# Patient Record
Sex: Male | Born: 1970 | Race: Black or African American | Hispanic: No | Marital: Single | Smoking: Current every day smoker
Health system: Southern US, Community
[De-identification: ages and names within clinical notes are randomized; demographics above are authoritative.]

## PROBLEM LIST (undated history)

## (undated) DIAGNOSIS — I8393 Asymptomatic varicose veins of bilateral lower extremities: Secondary | ICD-10-CM

## (undated) DIAGNOSIS — I10 Essential (primary) hypertension: Secondary | ICD-10-CM

## (undated) DIAGNOSIS — E119 Type 2 diabetes mellitus without complications: Secondary | ICD-10-CM

## (undated) DIAGNOSIS — H0014 Chalazion left upper eyelid: Secondary | ICD-10-CM

## (undated) HISTORY — DX: Asymptomatic varicose veins of bilateral lower extremities: I83.93

## (undated) HISTORY — PX: OTHER SURGICAL HISTORY: SHX169

## (undated) HISTORY — DX: Chalazion left upper eyelid: H00.14

## (undated) HISTORY — DX: Type 2 diabetes mellitus without complications: E11.9

---

## 2002-05-24 DIAGNOSIS — I10 Essential (primary) hypertension: Secondary | ICD-10-CM

## 2002-05-24 HISTORY — DX: Essential (primary) hypertension: I10

## 2004-09-27 ENCOUNTER — Emergency Department (HOSPITAL_COMMUNITY): Admission: EM | Admit: 2004-09-27 | Discharge: 2004-09-27 | Payer: Self-pay | Admitting: Emergency Medicine

## 2010-08-11 ENCOUNTER — Emergency Department (HOSPITAL_COMMUNITY)
Admission: EM | Admit: 2010-08-11 | Discharge: 2010-08-11 | Disposition: A | Discharge status: Home / Self Care | Payer: Self-pay | Attending: Emergency Medicine | Admitting: Emergency Medicine

## 2010-08-11 ENCOUNTER — Emergency Department (HOSPITAL_COMMUNITY): Payer: Self-pay

## 2010-08-11 DIAGNOSIS — M25569 Pain in unspecified knee: Secondary | ICD-10-CM | POA: Insufficient documentation

## 2010-08-11 DIAGNOSIS — R109 Unspecified abdominal pain: Secondary | ICD-10-CM | POA: Insufficient documentation

## 2010-08-11 DIAGNOSIS — R51 Headache: Secondary | ICD-10-CM | POA: Insufficient documentation

## 2010-08-11 DIAGNOSIS — E119 Type 2 diabetes mellitus without complications: Secondary | ICD-10-CM | POA: Insufficient documentation

## 2010-08-11 DIAGNOSIS — R112 Nausea with vomiting, unspecified: Secondary | ICD-10-CM | POA: Insufficient documentation

## 2010-08-11 LAB — DIFFERENTIAL
Basophils Absolute: 0 10*3/uL (ref 0.0–0.1)
Eosinophils Absolute: 0.1 10*3/uL (ref 0.0–0.7)
Lymphocytes Relative: 44 % (ref 12–46)
Lymphs Abs: 3.8 10*3/uL (ref 0.7–4.0)
Monocytes Absolute: 0.9 10*3/uL (ref 0.1–1.0)
Neutro Abs: 3.9 10*3/uL (ref 1.7–7.7)

## 2010-08-11 LAB — COMPREHENSIVE METABOLIC PANEL
AST: 21 U/L (ref 0–37)
BUN: 8 mg/dL (ref 6–23)
Calcium: 9.6 mg/dL (ref 8.4–10.5)
GFR calc non Af Amer: >60 mL/min (ref >60)
Sodium: 142 mEq/L (ref 135–145)
Total Protein: 7.5 g/dL (ref 6.0–8.3)

## 2010-08-11 LAB — CBC
HCT: 44.6 % (ref 39.0–52.0)
Hemoglobin: 15.4 g/dL (ref 13.0–17.0)
MCH: 32.1 pg (ref 26.0–34.0)
MCV: 92.9 fL (ref 78.0–100.0)
WBC: 8.7 10*3/uL (ref 4.0–10.5)

## 2010-08-11 LAB — URINE MICROSCOPIC-ADD ON

## 2010-08-11 LAB — URINALYSIS, ROUTINE W REFLEX MICROSCOPIC
Bilirubin Urine: NEGATIVE
Glucose, UA: NEGATIVE mg/dL
Ketones, ur: NEGATIVE mg/dL
Nitrite: NEGATIVE
Protein, ur: NEGATIVE mg/dL
Urobilinogen, UA: 0.2 mg/dL (ref 0.0–1.0)
pH: 6.5 (ref 5.0–8.0)

## 2010-08-11 LAB — GLUCOSE, CAPILLARY

## 2011-04-09 ENCOUNTER — Emergency Department (HOSPITAL_COMMUNITY)
Admission: EM | Admit: 2011-04-09 | Discharge: 2011-04-09 | Disposition: A | Discharge status: Home / Self Care | Payer: Self-pay | Attending: Emergency Medicine | Admitting: Emergency Medicine

## 2011-04-09 ENCOUNTER — Emergency Department (HOSPITAL_COMMUNITY): Payer: Self-pay

## 2011-04-09 ENCOUNTER — Encounter: Payer: Self-pay | Admitting: Emergency Medicine

## 2011-04-09 DIAGNOSIS — S63509A Unspecified sprain of unspecified wrist, initial encounter: Secondary | ICD-10-CM | POA: Insufficient documentation

## 2011-04-09 DIAGNOSIS — W230XXA Caught, crushed, jammed, or pinched between moving objects, initial encounter: Secondary | ICD-10-CM | POA: Insufficient documentation

## 2011-04-09 DIAGNOSIS — M7989 Other specified soft tissue disorders: Secondary | ICD-10-CM | POA: Insufficient documentation

## 2011-04-09 DIAGNOSIS — F17200 Nicotine dependence, unspecified, uncomplicated: Secondary | ICD-10-CM | POA: Insufficient documentation

## 2011-04-09 DIAGNOSIS — S63501A Unspecified sprain of right wrist, initial encounter: Secondary | ICD-10-CM

## 2011-04-09 DIAGNOSIS — M25539 Pain in unspecified wrist: Secondary | ICD-10-CM | POA: Insufficient documentation

## 2011-04-09 MED ORDER — IBUPROFEN 800 MG PO TABS
800.0000 mg | ORAL_TABLET | Freq: Once | ORAL | Status: AC
  Administered 2011-04-09: 800 mg via ORAL
  Filled 2011-04-09: qty 1

## 2011-04-09 MED ORDER — IBUPROFEN 800 MG PO TABS: 800.0000 mg | ORAL_TABLET | Freq: Three times a day (TID) | ORAL | Status: AC | PRN

## 2011-04-09 MED ORDER — HYDROCODONE-ACETAMINOPHEN 5-325 MG PO TABS: 2.0000 | ORAL_TABLET | ORAL | Status: AC | PRN

## 2011-04-09 NOTE — ED Notes (Signed)
Patient given discharge paperwork.  Went over discharge instructions with patient; instructed patient to take Norco and ibuprofen as directed, to follow up with the orthopedist, and to return to the ED for new, worsening, or concerning symptoms.

## 2011-04-09 NOTE — Progress Notes (Signed)
Orthopedic Tech Progress Note Patient Details:  Cameron Bean 03/16/1971 [id number]  Other Ortho Devices Type of Ortho Device:  (v elcro- wrist splint) Ortho Device Location: right  hand Ortho Device Interventions: Application   Irish Lack 04/09/2011, 7:56 PM

## 2011-04-09 NOTE — ED Notes (Signed)
Received bedside report from [place], [place].  Patient currently resting quietly in bed watching television on his laptop; introduced self to patient.  Patient has no questions, concerns, or requests at this time.  Will continue to monitor.

## 2011-04-09 NOTE — ED Notes (Signed)
Ortho states that they are on their way to apply wrist splint.
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
## 2011-04-09 NOTE — ED Notes (Signed)
Ortho at bedside.

## 2011-04-09 NOTE — ED Notes (Signed)
Paged ortho to apply wrist splint.

## 2012-01-07 ENCOUNTER — Encounter (HOSPITAL_COMMUNITY): Payer: Self-pay

## 2012-01-07 ENCOUNTER — Emergency Department (HOSPITAL_COMMUNITY)
Admission: EM | Admit: 2012-01-07 | Discharge: 2012-01-08 | Disposition: A | Discharge status: Home / Self Care | Payer: Self-pay | Attending: Emergency Medicine | Admitting: Emergency Medicine

## 2012-01-07 DIAGNOSIS — M79609 Pain in unspecified limb: Secondary | ICD-10-CM | POA: Insufficient documentation

## 2012-01-07 DIAGNOSIS — M7989 Other specified soft tissue disorders: Secondary | ICD-10-CM | POA: Insufficient documentation

## 2012-01-07 DIAGNOSIS — B354 Tinea corporis: Secondary | ICD-10-CM | POA: Insufficient documentation

## 2012-01-07 DIAGNOSIS — I10 Essential (primary) hypertension: Secondary | ICD-10-CM | POA: Insufficient documentation

## 2012-01-07 DIAGNOSIS — F17200 Nicotine dependence, unspecified, uncomplicated: Secondary | ICD-10-CM | POA: Insufficient documentation

## 2012-01-07 HISTORY — DX: Essential (primary) hypertension: I10

## 2012-01-07 LAB — CBC WITH DIFFERENTIAL/PLATELET
Basophils Absolute: 0 10*3/uL (ref 0.0–0.1)
Basophils Relative: 0 % (ref 0–1)
MCH: 31.7 pg (ref 26.0–34.0)
Monocytes Relative: 8 % (ref 3–12)
Neutro Abs: 4.5 10*3/uL (ref 1.7–7.7)
Neutrophils Relative %: 51 % (ref 43–77)

## 2012-01-07 LAB — BASIC METABOLIC PANEL
BUN: 9 mg/dL (ref 6–23)
CO2: 30 mEq/L (ref 19–32)
GFR calc Af Amer: >90 mL/min (ref >90)
Glucose, Bld: 139 mg/dL — ABNORMAL HIGH (ref 70–99)
Potassium: 4.4 mEq/L (ref 3.5–5.1)
Sodium: 141 mEq/L (ref 135–145)

## 2012-01-07 NOTE — ED Notes (Signed)
Pt advised of his wait time

## 2012-01-07 NOTE — ED Provider Notes (Signed)
History     CSN: [id number]  Arrival date & time 01/07/12  1649   First MD Initiated Contact with Patient 01/07/12 2037      Chief Complaint  Patient presents with  . Leg Swelling    (Consider location/radiation/quality/duration/timing/severity/associated sxs/prior treatment) HPI This is a 41 year old man, previously healthy, presents with unilateral left leg pain for 2 weeks. He reports pain that his dull, 5/10 and constant in the same leg. He does not remember history of trauma to this leg, but it started out when he was work out. He denies history of fever, but reports chills, and reduced appetite. The pain is worse when he walks and relieved when he is in stable and when it is in an elevated position. This is the first time he has experienced such swelling of his extremity. He has no history of blood clots and no history of long distance travel recently. He is still able to walk, but with soreness in his left calf muscle. He admits to a history of smoking about one pack a day for the last 18 years, but he denies IV drug use. He has a remote history of type 2 diabetes, but he says that he is no longer diabetic after he started losing weight and changing his diet. Prior to that he had been started on metformin, which he stopped.  Past Medical History  Diagnosis Date  . Hypertension     No past surgical history on file.  No family history on file.  History  Substance Use Topics  . Smoking status: Current Everyday Smoker  . Smokeless tobacco: Not on file  . Alcohol Use: No      Review of Systems  Constitutional: Positive for chills, appetite change and fatigue. Negative for fever, diaphoresis, activity change and unexpected weight change.  HENT: Negative.   Eyes: Negative.   Respiratory: Negative for apnea, cough, choking, chest tightness, shortness of breath, wheezing and stridor.   Cardiovascular: Negative for chest pain, palpitations and leg swelling.  Genitourinary:  Negative.   Musculoskeletal: Negative for myalgias, back pain, joint swelling, arthralgias and gait problem.  Hematological: Negative.   Psychiatric/Behavioral: Negative.     Allergies  Review of patient's allergies indicates no known allergies.  Home Medications   Current Outpatient Rx  Name Route Sig Dispense Refill  . CETIRIZINE HCL 10 MG PO TABS Oral Take 10 mg by mouth daily.      Carma Leaven M PLUS PO TABS Oral Take 1 tablet by mouth daily.        BP 138/73  Pulse 73  Temp 98.3 F (36.8 C) (Oral)  Resp 18  SpO2 100%  Physical Exam  Constitutional: He is oriented to person, place, and time. He appears well-nourished. No distress.  HENT:  Head: Normocephalic and atraumatic.  Eyes: Conjunctivae and EOM are normal. Pupils are equal, round, and reactive to light.  Neck: Normal range of motion. Neck supple.  Cardiovascular: Normal rate, regular rhythm and normal heart sounds.  Exam reveals no gallop and no friction rub.   No murmur heard. Pulmonary/Chest: Effort normal and breath sounds normal. No respiratory distress. He has no wheezes. He has no rales. He exhibits no tenderness.  Abdominal: Soft. Bowel sounds are normal.  Musculoskeletal: He exhibits edema and tenderness.       Right lower leg: Normal.       Left lower leg: He exhibits tenderness, swelling, edema and laceration. He exhibits no bony tenderness and no deformity.  The left leg is visibly swollen to the level of the knee. The skin has a small laceration around the shin area. It does not look septic. The distal pulses are present and strong. There is some tenderness on the palpation of the calf muscle. It fills only slightly warmer than the left. There is no hyperemia or any deformity. There is no groin lymph nodes swelling.   The right leg is normal on examination.   Neurological: He is alert and oriented to person, place, and time. He has normal reflexes. No cranial nerve deficit.  Skin: Rash noted. He is  not diaphoretic. No pallor.  Psychiatric: He has a normal mood and affect.    ED Course  Procedures (including critical care time)  Labs Reviewed  BASIC METABOLIC PANEL - Abnormal; Notable for the following:    Glucose, Bld 139 (*)     All other components within normal limits  CBC WITH DIFFERENTIAL   Dg Tibia/fibula Left  01/08/2012  *RADIOLOGY REPORT*  Clinical Data: Pain and swelling.  Numbness and tingling for the past 2 weeks.  LEFT TIBIA AND FIBULA - 2 VIEW  Comparison: Left knee 08/11/2010  Findings: Stable appearance of degenerative changes in the left knee.  No evidence of acute fracture or subluxation of the left tibia or fibula.  Serpiginous shadows in the soft tissues with calcification suggesting venous varices.  No focal bone lesion or bone destruction.  Bone cortex and trabecular architecture appear intact.  No abnormal periosteal reaction.  IMPRESSION: No evidence of acute fracture or subluxation.  Stable degenerative changes in the knee.  Soft tissue changes consistent with venous varices.  Original Report Authenticated By: Marlon Pel, M.D.     No diagnosis found.    MDM  1. Left Leg Swelling and Pain And name man with unilateral left leg sweating and pain without a clear history of fever or chills, there is a concern over DVT in that leg versus a cellulitis or osteomyelitis. His Well's score for a DVT is 3 which places him in low risk. He is a very smoker but no other risk factors for a blood clot. We will need to do a doppler scan tomorrow. Xray of the left Tibular/Fibular is normal with some evidence of venous varices. CBC and Bmet are within normal ranges.  2. Tinea Corporis I have prescribed Clotrimazole cream.        Dow Adolph, MD 01/08/12 [phone]

## 2012-01-07 NOTE — ED Notes (Signed)
Pt here for leg swelling and pain in left lower extremity, left is more warm than right, pt sts was living sedentary and started working out when symptoms started.

## 2012-01-08 ENCOUNTER — Ambulatory Visit (HOSPITAL_COMMUNITY)
Admission: RE | Admit: 2012-01-08 | Discharge: 2012-01-08 | Disposition: A | Discharge status: Home / Self Care | Payer: Self-pay | Source: Ambulatory Visit | Attending: Emergency Medicine | Admitting: Emergency Medicine

## 2012-01-08 ENCOUNTER — Emergency Department (HOSPITAL_COMMUNITY): Payer: Self-pay

## 2012-01-08 DIAGNOSIS — M79609 Pain in unspecified limb: Secondary | ICD-10-CM | POA: Insufficient documentation

## 2012-01-08 DIAGNOSIS — M7989 Other specified soft tissue disorders: Secondary | ICD-10-CM | POA: Insufficient documentation

## 2012-01-08 MED ORDER — CLOTRIMAZOLE 1 % EX CREA: TOPICAL_CREAM | CUTANEOUS | Status: DC

## 2012-01-08 MED ORDER — ENOXAPARIN SODIUM 150 MG/ML ~~LOC~~ SOLN
130.0000 mg | Freq: Once | SUBCUTANEOUS | Status: AC
  Administered 2012-01-08: 130 mg via SUBCUTANEOUS
  Filled 2012-01-08: qty 1

## 2012-01-08 NOTE — ED Notes (Signed)
To x-ray

## 2012-01-08 NOTE — ED Provider Notes (Signed)
I saw and evaluated the patient, reviewed the resident's note and I agree with the findings and plan.  Pt with unilateral leg swelling. Wels score is 3. Lovenox given. CXR is normal. Pt to come back for US duplex.  Derwood Kaplan, MD 01/08/12 [phone]

## 2012-01-08 NOTE — ED Notes (Signed)
Pt returned from xray

## 2012-01-09 NOTE — Progress Notes (Signed)
VASCULAR LAB PRELIMINARY  PRELIMINARY  PRELIMINARY  PRELIMINARY  Left lower extremity venous Doppler completed.    Preliminary report:  There is no DVT or SVT noted in the left lower extremity.  Allea Kassner, 01/09/2012, 2:57 PM

## 2012-03-03 ENCOUNTER — Emergency Department (HOSPITAL_COMMUNITY)
Admission: EM | Admit: 2012-03-03 | Discharge: 2012-03-04 | Disposition: A | Discharge status: Home / Self Care | Payer: Self-pay | Attending: Emergency Medicine | Admitting: Emergency Medicine

## 2012-03-03 ENCOUNTER — Encounter (HOSPITAL_COMMUNITY): Payer: Self-pay | Admitting: *Deleted

## 2012-03-03 DIAGNOSIS — F17200 Nicotine dependence, unspecified, uncomplicated: Secondary | ICD-10-CM | POA: Insufficient documentation

## 2012-03-03 DIAGNOSIS — H109 Unspecified conjunctivitis: Secondary | ICD-10-CM | POA: Insufficient documentation

## 2012-03-03 DIAGNOSIS — I10 Essential (primary) hypertension: Secondary | ICD-10-CM | POA: Insufficient documentation

## 2012-03-03 NOTE — ED Notes (Signed)
Patient with right eye pain/swelling and drainage for about a week.

## 2012-03-04 MED ORDER — TOBRAMYCIN 0.3 % OP OINT
TOPICAL_OINTMENT | Freq: Three times a day (TID) | OPHTHALMIC | Status: DC
  Filled 2012-03-04: qty 3.5

## 2012-03-04 MED ORDER — CEFTRIAXONE SODIUM 1 G IJ SOLR
1.0000 g | Freq: Once | INTRAMUSCULAR | Status: AC
  Administered 2012-03-04: 1 g via INTRAMUSCULAR
  Filled 2012-03-04: qty 10

## 2012-03-04 NOTE — ED Notes (Signed)
Pt ambulating independently w/ steady gait on d/c in no acute distress, A&Ox4. D/c instructions reviewed w/ pt - pt denies any further questions or concerns at present.  

## 2012-03-04 NOTE — ED Provider Notes (Signed)
Medical screening examination/treatment/procedure(s) were performed by non-physician practitioner and as supervising physician I was immediately available for consultation/collaboration.  Kinta Martis, MD 03/04/12 0608 

## 2012-03-04 NOTE — ED Provider Notes (Signed)
History     CSN: [id number]  Arrival date & time 03/03/12  2050   First MD Initiated Contact with Patient 03/03/12 2205      Chief Complaint  Patient presents with  . Eye Pain   HPI  History provided by the patient. Patient is a 41 year old male with history of hypertension who presents with complaints of persistent right eye swelling, discharge and burning pain. Patient states symptoms have been present for one to 2 weeks without improvement. Patient initially thought symptoms were related to allergies and he began taking Zyrtec and Benadryl without any improvements. He has been using compresses over the eye also without any change. He reports having large amounts of drainage yellow green slime from his eye. Patient denies having similar symptoms previously. Denies any known sick contacts. Denies any fever, chills, sweats, nasal congestion, rhinorrhea or cough. Denies any vision change or loss.    Past Medical History  Diagnosis Date  . Hypertension     History reviewed. No pertinent past surgical history.  No family history on file.  History  Substance Use Topics  . Smoking status: Current Every Day Smoker  . Smokeless tobacco: Not on file  . Alcohol Use: No      Review of Systems  Constitutional: Negative for fever and chills.  HENT: Negative for congestion, sore throat and rhinorrhea.   Eyes: Positive for photophobia, pain, discharge, redness and itching. Negative for visual disturbance.  Respiratory: Negative for cough and shortness of breath.   Gastrointestinal: Negative for nausea, vomiting and abdominal pain.  Skin: Negative for rash.    Allergies  Review of patient's allergies indicates no known allergies.  Home Medications   Current Outpatient Rx  Name Route Sig Dispense Refill  . CETIRIZINE HCL 10 MG PO TABS Oral Take 10 mg by mouth daily.      Marland Kitchen DIPHENHYDRAMINE HCL (SLEEP) 25 MG PO TABS Oral Take 25 mg by mouth 2 (two) times daily as needed. For  allergies    . HYDROCODONE-ACETAMINOPHEN 5-325 MG PO TABS Oral Take 1 tablet by mouth every 6 (six) hours as needed. For pain    . IBUPROFEN 200 MG PO TABS Oral Take 200 mg by mouth every 6 (six) hours as needed. For pain    . KETOTIFEN FUMARATE 0.025 % OP SOLN Right Eye Place 2 drops into the right eye as needed. For swollen eye    . THERA M PLUS PO TABS Oral Take 1 tablet by mouth daily.        BP 135/86  Pulse 76  Temp 98.5 F (36.9 C) (Oral)  Resp 16  SpO2 97%  Physical Exam  Nursing note and vitals reviewed. Constitutional: He is oriented to person, place, and time. He appears well-developed and well-nourished. No distress.  HENT:  Head: Normocephalic.  Eyes: EOM are normal. Pupils are equal, round, and reactive to light. Right eye exhibits discharge and exudate. Right eye exhibits no chemosis. Right conjunctiva is injected.  Cardiovascular: Normal rate and regular rhythm.   Pulmonary/Chest: Effort normal and breath sounds normal. No respiratory distress. He has no wheezes. He has no rales.  Neurological: He is alert and oriented to person, place, and time.  Skin: Skin is warm.  Psychiatric: He has a normal mood and affect. His behavior is normal.    ED Course  Procedures     1. Conjunctivitis of right eye       MDM  12:30AM patient seen and evaluated. Symptoms and physical  exam findings consistent with conjunctivitis of right eye. Patient given given instructions. Patient also given ophthalmology referral.       Angus Seller, PA 03/04/12 [phone]

## 2012-03-04 NOTE — ED Notes (Signed)
Pt reports rt eye pain, redness, and swelling x1 week - pt initially thought this was d/t allergies. Pt has been using ibuprofen and vicodin at home w/o relief. Yellow/white drainage noted from rt eye, upper lid swelling, and scleral redness. Pt reports a burning pain to rt eye. Pt agitated about wait times, process explained to pt - apologized to pt for wait time. Pt A&Ox4

## 2013-06-19 ENCOUNTER — Ambulatory Visit: Payer: Self-pay | Admitting: Internal Medicine

## 2016-08-23 ENCOUNTER — Ambulatory Visit (INDEPENDENT_AMBULATORY_CARE_PROVIDER_SITE_OTHER): Payer: Self-pay | Admitting: Internal Medicine

## 2016-08-23 ENCOUNTER — Encounter: Payer: Self-pay | Admitting: Internal Medicine

## 2016-08-23 VITALS — BP 128/88 | HR 72 | Resp 12 | Ht 74.0 in | Wt 221.0 lb

## 2016-08-23 DIAGNOSIS — R739 Hyperglycemia, unspecified: Secondary | ICD-10-CM

## 2016-08-23 DIAGNOSIS — Z7251 High risk heterosexual behavior: Secondary | ICD-10-CM

## 2016-08-23 DIAGNOSIS — E1165 Type 2 diabetes mellitus with hyperglycemia: Secondary | ICD-10-CM

## 2016-08-23 DIAGNOSIS — J3089 Other allergic rhinitis: Secondary | ICD-10-CM | POA: Insufficient documentation

## 2016-08-23 DIAGNOSIS — F32A Depression, unspecified: Secondary | ICD-10-CM

## 2016-08-23 DIAGNOSIS — I10 Essential (primary) hypertension: Secondary | ICD-10-CM

## 2016-08-23 DIAGNOSIS — G6289 Other specified polyneuropathies: Secondary | ICD-10-CM

## 2016-08-23 DIAGNOSIS — F329 Major depressive disorder, single episode, unspecified: Secondary | ICD-10-CM

## 2016-08-23 LAB — GLUCOSE, POCT (MANUAL RESULT ENTRY): POC Glucose: 236 mg/dL — AB (ref 70–99)

## 2016-08-23 MED ORDER — GLUCOSE BLOOD VI STRP: ORAL_STRIP | 12 refills | Status: AC

## 2016-08-23 MED ORDER — AGAMATRIX PRESTO W/DEVICE KIT: PACK | 0 refills | Status: AC

## 2016-08-23 MED ORDER — METFORMIN HCL ER 500 MG PO TB24: ORAL_TABLET | ORAL | 11 refills | Status: DC

## 2016-08-23 MED ORDER — CETIRIZINE HCL 10 MG PO TABS: 10.0000 mg | ORAL_TABLET | Freq: Every day | ORAL | 11 refills | Status: DC

## 2016-08-23 MED ORDER — GABAPENTIN 100 MG PO CAPS: ORAL_CAPSULE | ORAL | 11 refills | Status: DC
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
## 2016-08-24 ENCOUNTER — Telehealth: Payer: Self-pay | Admitting: Licensed Clinical Social Worker

## 2016-08-24 LAB — CBC WITH DIFFERENTIAL/PLATELET
BASOS ABS: 0 10*3/uL (ref 0.0–0.2)
Basos: 0 %
EOS (ABSOLUTE): 0.1 10*3/uL (ref 0.0–0.4)
Eos: 1 %
Hematocrit: 43.3 % (ref 37.5–51.0)
Hemoglobin: 14.8 g/dL (ref 13.0–17.7)
IMMATURE GRANS (ABS): 0 10*3/uL (ref 0.0–0.1)
Immature Granulocytes: 0 %
LYMPHS: 39 %
Lymphocytes Absolute: 3.5 10*3/uL — ABNORMAL HIGH (ref 0.7–3.1)
MCH: 31.2 pg (ref 26.6–33.0)
MCHC: 34.2 g/dL (ref 31.5–35.7)
MCV: 91 fL (ref 79–97)
Monocytes Absolute: 1 10*3/uL — ABNORMAL HIGH (ref 0.1–0.9)
Monocytes: 11 %
NEUTROS ABS: 4.5 10*3/uL (ref 1.4–7.0)
NEUTROS PCT: 49 %
PLATELETS: 189 10*3/uL (ref 150–379)
RBC: 4.75 x10E6/uL (ref 4.14–5.80)
RDW: 13.2 % (ref 12.3–15.4)
WBC: 9.1 10*3/uL (ref 3.4–10.8)

## 2016-08-24 LAB — INSULIN, RANDOM: INSULIN: 12.9 u[IU]/mL (ref 2.6–24.9)

## 2016-08-24 LAB — COMPREHENSIVE METABOLIC PANEL
ALK PHOS: 76 IU/L (ref 39–117)
ALT: 18 IU/L (ref 0–44)
AST: 11 IU/L (ref 0–40)
Albumin/Globulin Ratio: 1.7 (ref 1.2–2.2)
Albumin: 4.4 g/dL (ref 3.5–5.5)
BILIRUBIN TOTAL: 0.5 mg/dL (ref 0.0–1.2)
BUN/Creatinine Ratio: 21 — ABNORMAL HIGH (ref 9–20)
BUN: 13 mg/dL (ref 6–24)
CHLORIDE: 97 mmol/L (ref 96–106)
CO2: 26 mmol/L (ref 18–29)
Calcium: 9.7 mg/dL (ref 8.7–10.2)
Creatinine, Ser: 0.63 mg/dL — ABNORMAL LOW (ref 0.76–1.27)
GFR calc non Af Amer: 120 mL/min/{1.73_m2} (ref >59)
GFR, EST AFRICAN AMERICAN: 139 mL/min/{1.73_m2} (ref >59)
GLUCOSE: 219 mg/dL — AB (ref 65–99)
Globulin, Total: 2.6 g/dL (ref 1.5–4.5)
Potassium: 4.1 mmol/L (ref 3.5–5.2)
Sodium: 139 mmol/L (ref 134–144)
TOTAL PROTEIN: 7 g/dL (ref 6.0–8.5)

## 2016-08-24 LAB — TSH: TSH: 1.18 u[IU]/mL (ref 0.450–4.500)

## 2016-08-24 LAB — HGB A1C W/O EAG: HEMOGLOBIN A1C: 11 % — AB (ref 4.8–5.6)

## 2016-08-24 LAB — MICROALBUMIN / CREATININE URINE RATIO
Creatinine, Urine: 177.2 mg/dL
MICROALB/CREAT RATIO: 14.4 mg/g{creat} (ref 0.0–30.0)
MICROALBUM., U, RANDOM: 25.5 ug/mL

## 2016-08-24 LAB — HIV ANTIBODY (ROUTINE TESTING W REFLEX): HIV SCREEN 4TH GENERATION: NONREACTIVE

## 2016-08-24 NOTE — Telephone Encounter (Signed)
LCSW called pt to schedule counseling session following verbal referral from Dr Delrae Alfred. Scheduled for 4/4 at 4:00pm.

## 2016-08-25 ENCOUNTER — Ambulatory Visit (INDEPENDENT_AMBULATORY_CARE_PROVIDER_SITE_OTHER): Payer: Self-pay | Admitting: Licensed Clinical Social Worker

## 2016-08-25 DIAGNOSIS — F308 Other manic episodes: Secondary | ICD-10-CM
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
## 2016-09-06 ENCOUNTER — Other Ambulatory Visit: Payer: Self-pay | Admitting: Licensed Clinical Social Worker

## 2016-09-09 ENCOUNTER — Ambulatory Visit (INDEPENDENT_AMBULATORY_CARE_PROVIDER_SITE_OTHER): Payer: Self-pay | Admitting: Internal Medicine

## 2016-09-09 VITALS — BP 128/78 | HR 64 | Resp 12 | Ht 74.0 in | Wt 221.0 lb

## 2016-09-09 DIAGNOSIS — J3089 Other allergic rhinitis: Secondary | ICD-10-CM

## 2016-09-09 DIAGNOSIS — K029 Dental caries, unspecified: Secondary | ICD-10-CM

## 2016-09-09 DIAGNOSIS — I8393 Asymptomatic varicose veins of bilateral lower extremities: Secondary | ICD-10-CM

## 2016-09-09 DIAGNOSIS — E1165 Type 2 diabetes mellitus with hyperglycemia: Secondary | ICD-10-CM

## 2016-09-09 DIAGNOSIS — H0014 Chalazion left upper eyelid: Secondary | ICD-10-CM

## 2016-09-09 LAB — GLUCOSE, POCT (MANUAL RESULT ENTRY): POC Glucose: 160 mg/dl — AB (ref 70–99)

## 2016-09-14 ENCOUNTER — Other Ambulatory Visit: Payer: Self-pay | Admitting: Licensed Clinical Social Worker

## 2016-09-23 ENCOUNTER — Telehealth: Payer: Self-pay | Admitting: Internal Medicine
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
## 2016-10-04 ENCOUNTER — Ambulatory Visit (INDEPENDENT_AMBULATORY_CARE_PROVIDER_SITE_OTHER): Payer: Self-pay | Admitting: Internal Medicine

## 2016-10-04 ENCOUNTER — Encounter: Payer: Self-pay | Admitting: Internal Medicine

## 2016-10-04 ENCOUNTER — Telehealth: Payer: Self-pay | Admitting: Internal Medicine

## 2016-10-04 VITALS — BP 122/72 | HR 72 | Resp 12 | Ht 74.25 in | Wt 216.0 lb

## 2016-10-04 DIAGNOSIS — E1165 Type 2 diabetes mellitus with hyperglycemia: Secondary | ICD-10-CM

## 2016-10-04 LAB — GLUCOSE, POCT (MANUAL RESULT ENTRY): POC Glucose: 160 mg/dl — AB (ref 70–99)

## 2016-10-04 MED ORDER — METFORMIN HCL 500 MG PO TABS: 500.0000 mg | ORAL_TABLET | Freq: Two times a day (BID) | ORAL | 11 refills | Status: DC

## 2016-10-04 MED ORDER — GABAPENTIN 300 MG PO CAPS: ORAL_CAPSULE | ORAL | 11 refills | Status: DC

## 2016-10-04 NOTE — Progress Notes (Signed)
   Subjective:    Patient ID: Cameron Bean, male    DOB: 1970-06-28, 46 y.o.   MRN: [id number]  HPI  1.  DM:  Did not bring in sugars.  States generally in 200s.  Feels he is doing everything he can with diet. Has lost 5 lbs and not trying to lose. Feels he is just not tolerating Metformin--causes GI pain.  Five minutes after eating, has large formed stool.  States he tolerated name brand Glucophage and would like to try the name brand again.  Feels tired all the time.  Smokes MJ regularly. Is not skipping Metformin.   Current Meds  Medication Sig  . Blood Glucose Monitoring Suppl (AGAMATRIX PRESTO) w/Device KIT Check sugar before meals twice daily  . cetirizine (ZYRTEC) 10 MG tablet Take 1 tablet (10 mg total) by mouth daily.  Marland Kitchen gabapentin (NEURONTIN) 100 MG capsule 1 cap by mouth at bedtime, increase in 3 days to 2 caps at bedtime, increase in 6 days to 3 caps at bedtime.  Marland Kitchen glucose blood (AGAMATRIX PRESTO TEST) test strip Use as instructed  . Multiple Vitamins-Minerals (MULTIVITAMINS THER. W/MINERALS) TABS Take 1 tablet by mouth daily.    . [DISCONTINUED] metFORMIN (GLUCOPHAGE-XR) 500 MG 24 hr tablet 1 tab by mouth twice daily with meal    No Known Allergies   Review of Systems     Objective:   Physical Exam Poor hygiene and breath Smells of MJ Lungs:  CTA CV:  RRR without murmur or rub, radial pulses normal and equal Abd:  S, NT, No HSM or mass, + BS, excess fat of abdomen noted.        Assessment & Plan:  1.  DM:  Gave patient literature on Mediterranean diet as he wanted more specifics about how to eat.  Encouraged more protein and good fat intake. Encouraged increased physical activity.  2.  Peripheral Neuropathy:  Switch to 300 mg caps of Gabapentin and take twice daily.  Previously titrated up on his dose.  Not clear how much this is helping him.

## 2016-10-04 NOTE — Telephone Encounter (Signed)
Error

## 2016-10-04 NOTE — Telephone Encounter (Signed)
Discussed in office visit.

## 2016-10-30 ENCOUNTER — Encounter: Payer: Self-pay | Admitting: Internal Medicine

## 2016-10-30 DIAGNOSIS — I8393 Asymptomatic varicose veins of bilateral lower extremities: Secondary | ICD-10-CM

## 2016-10-30 DIAGNOSIS — H0014 Chalazion left upper eyelid: Secondary | ICD-10-CM

## 2016-10-30 DIAGNOSIS — E119 Type 2 diabetes mellitus without complications: Secondary | ICD-10-CM

## 2016-10-30 HISTORY — DX: Type 2 diabetes mellitus without complications: E11.9

## 2016-10-30 HISTORY — DX: Chalazion left upper eyelid: H00.14

## 2016-10-30 HISTORY — DX: Asymptomatic varicose veins of bilateral lower extremities: I83.93
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
## 2016-11-03 ENCOUNTER — Telehealth: Payer: Self-pay

## 2016-11-03 ENCOUNTER — Other Ambulatory Visit: Payer: Self-pay

## 2016-11-03 MED ORDER — GLUCOPHAGE 500 MG PO TABS: 500.0000 mg | ORAL_TABLET | Freq: Two times a day (BID) | ORAL | 11 refills | Status: DC

## 2016-11-03 NOTE — Telephone Encounter (Signed)
Patent called in stating that neither pharmacy had received his Rx's from 10/04/16. I spoke with [facility] and [facility] and both had received the prescriptions. I informed patient. Pharmacy called asking for new Rx for Brand name Glucophage. [facility]. Rx price is $ 4584 patient was informed asked for 2 forms of metformin due to cost and  Can not afford to pick up Rx until Friday. In formed he can not switch back and forth. Pharmacy sent over a request for Glucophage XR. Per Dr. Delrae AlfredMulberry the prescription stands as written.

## 2016-11-09 ENCOUNTER — Ambulatory Visit: Payer: Self-pay | Admitting: Internal Medicine

## 2016-11-22 ENCOUNTER — Ambulatory Visit (INDEPENDENT_AMBULATORY_CARE_PROVIDER_SITE_OTHER): Payer: Self-pay | Admitting: Internal Medicine

## 2016-11-22 ENCOUNTER — Encounter: Payer: Self-pay | Admitting: Internal Medicine

## 2016-11-22 VITALS — BP 150/80 | HR 78 | Resp 12 | Ht 74.25 in | Wt 207.0 lb

## 2016-11-22 DIAGNOSIS — R634 Abnormal weight loss: Secondary | ICD-10-CM

## 2016-11-22 DIAGNOSIS — R102 Pelvic and perineal pain: Secondary | ICD-10-CM

## 2016-11-22 DIAGNOSIS — R030 Elevated blood-pressure reading, without diagnosis of hypertension: Secondary | ICD-10-CM

## 2016-11-22 DIAGNOSIS — R300 Dysuria: Secondary | ICD-10-CM

## 2016-11-22 DIAGNOSIS — E1165 Type 2 diabetes mellitus with hyperglycemia: Secondary | ICD-10-CM

## 2016-11-22 LAB — GLUCOSE, POCT (MANUAL RESULT ENTRY): POC GLUCOSE: 128 mg/dL — AB (ref 70–99)

## 2016-11-22 LAB — POCT URINALYSIS DIPSTICK
BILIRUBIN UA: NEGATIVE
Glucose, UA: 500
KETONES UA: NEGATIVE
LEUKOCYTES UA: NEGATIVE
Nitrite, UA: NEGATIVE
PROTEIN UA: 30
Spec Grav, UA: 1.01 (ref 1.010–1.025)
Urobilinogen, UA: 1 E.U./dL
pH, UA: 6.5 (ref 5.0–8.0)

## 2016-11-22 MED ORDER — GABAPENTIN 300 MG PO CAPS: ORAL_CAPSULE | ORAL | 11 refills | Status: DC

## 2016-11-22 NOTE — Patient Instructions (Signed)
To call Thursday morning to see if will be back to work with Henreitta CeaAndy Fox on diet

## 2016-11-22 NOTE — Progress Notes (Signed)
Subjective:    Patient ID: Cameron Bean, male    DOB: 10-31-1970, 46 y.o.   MRN: [id number]  HPI  Difficulty with focusing history  1.  Elevated BP:  Worried about his mother.  He just found out on way to appointment that she was airlifted to [place], [place] and she is in critical condition.  2.  DM:  He is very physically active every day.  Walks dog.  Walks on his own. Lifts weights if feeling well--generally twice weekly.  On bike riding around daily. Sugars are running 170--only checking 3 times weekly.  Can be up to high 200s when stomach hurts.  Not clear how often this is.  He did not purchase the name brand Glucophage as $90 and gives a number of legal issues he has that he must spend money on.    Continues to lose weight.  Has lost 4 lbs since 08/23/2016. Sometimes with tremors.  No palpitations   Eats around 10 am:  Oatmeal, blueberries with honey or yogurt with added berries.  May make eggs and grits with cheese. at time with [place] sausage Lunch:  Stuffed clams, crabcakes.  Unable to get good dinner history   3.  Chronic pain of legs and feet:  Better since moving to 300 mg caps of Gabapentin.  Would like to move to 3 times daily as pain breakthrough most days.  4. Bilateral Low abdominal pain:  Sore and started 2 weeks ago.  Noted his belt is hitting him here and better after wearing belt higher.  Is there constantly for most part.  Has been sexually active twice and without condom once.  Has had some difficulty with urinating.  Hurts in suprapubic area and even in shaft of penis.  No penile discharge.  No pain with ejaculation since this started hurting.  Has loose stool at times, not clear if any worse than usual.  Same with constipation--has at times not clear if any worse since pain started.  No melena or hematochezia. No fever.   Current Meds  Medication Sig  . cetirizine (ZYRTEC) 10 MG tablet Take 1 tablet (10 mg total) by mouth daily.  Marland Kitchen gabapentin (NEURONTIN) 300  MG capsule 1 cap by mouth three times daily  . GLUCOPHAGE 500 MG tablet Take 1 tablet (500 mg total) by mouth 2 (two) times daily with a meal.  . Multiple Vitamins-Minerals (MULTIVITAMINS THER. W/MINERALS) TABS Take 1 tablet by mouth daily.    . [DISCONTINUED] gabapentin (NEURONTIN) 300 MG capsule 1 cap by mouth twice daily  . [DISCONTINUED] gabapentin (NEURONTIN) 300 MG capsule 1 cap by mouth three times daily    No Known Allergies      Review of Systems     Objective:   Physical Exam Smells strongly of cigarette smoke NAD HEENT:  Terrible oral hygiene with thick plaque, discoloration of multiple teeth.  Many caried teeth, broken teeth. Neck:  Supple, No adenopathy.  Thyroid upper limits of normal in size, no mass. Lungs:  CTA CV:  RRR with normal S1 and S2, No S3, S4 or murmur.  Radial pulses normal and equal. Abd:  S, tender over left lateral muscle wall most significantly.  Similar tenderness in suprapubic area.  No flank tenderness. + BS, No HSM or mass. GU:  Shotty bilateral inguinal nodes.  Possible mild tenderness. No testicular tenderness or mass.  Tender with palpation of penile shaft.  No discharge.  No skin changes or lesions.  Assessment & Plan:  1.  DM:  Not clear if he is consistent with treatment.  Not adequately home monitoring.  A1C.  Urged him to get better with checking sugars so we can see trends and decide better how to proceed. Encouraged continued physical activity and eating healthier. Will have him meet with diabetic prevention coach next Thursday.  2.  Weight loss:  Could be just from all his newly found physical activity.  Check CBC, CMP, TSH, though the latter was fine in April.  3.  Abdominal pain:  Some of this is muscle wall pain.  Had a bit of blood in urine.  Sending urine for culture as well as GC/chlamydia, RPR.  Recent HIV negative.  No treatment for now.  4.  Chronic leg pain:  Increase gabapentin to 300 mg 3 times daily.  4.  Elevated  BP:  Likely due to pain and stress with mother's illness.

## 2016-11-23 LAB — COMPREHENSIVE METABOLIC PANEL
A/G RATIO: 1.6 (ref 1.2–2.2)
ALK PHOS: 57 IU/L (ref 39–117)
ALT: 8 IU/L (ref 0–44)
AST: 13 IU/L (ref 0–40)
Albumin: 4.1 g/dL (ref 3.5–5.5)
BUN/Creatinine Ratio: 13 (ref 9–20)
BUN: 10 mg/dL (ref 6–24)
Bilirubin Total: 0.5 mg/dL (ref 0.0–1.2)
CO2: 26 mmol/L (ref 20–29)
Calcium: 9.2 mg/dL (ref 8.7–10.2)
Chloride: 101 mmol/L (ref 96–106)
Creatinine, Ser: 0.75 mg/dL — ABNORMAL LOW (ref 0.76–1.27)
GFR calc Af Amer: 129 mL/min/{1.73_m2} (ref >59)
GFR, EST NON AFRICAN AMERICAN: 112 mL/min/{1.73_m2} (ref >59)
GLOBULIN, TOTAL: 2.5 g/dL (ref 1.5–4.5)
Glucose: 173 mg/dL — ABNORMAL HIGH (ref 65–99)
POTASSIUM: 4.1 mmol/L (ref 3.5–5.2)
SODIUM: 141 mmol/L (ref 134–144)
Total Protein: 6.6 g/dL (ref 6.0–8.5)

## 2016-11-23 LAB — CBC WITH DIFFERENTIAL/PLATELET
Basophils Absolute: 0 10*3/uL (ref 0.0–0.2)
Basos: 0 %
EOS (ABSOLUTE): 0.1 10*3/uL (ref 0.0–0.4)
EOS: 2 %
Hematocrit: 39.5 % (ref 37.5–51.0)
Hemoglobin: 13.3 g/dL (ref 13.0–17.7)
IMMATURE GRANULOCYTES: 0 %
Immature Grans (Abs): 0 10*3/uL (ref 0.0–0.1)
LYMPHS: 43 %
Lymphocytes Absolute: 3.5 10*3/uL — ABNORMAL HIGH (ref 0.7–3.1)
MCH: 31.1 pg (ref 26.6–33.0)
MCHC: 33.7 g/dL (ref 31.5–35.7)
MCV: 93 fL (ref 79–97)
Monocytes Absolute: 0.7 10*3/uL (ref 0.1–0.9)
Monocytes: 9 %
NEUTROS PCT: 46 %
Neutrophils Absolute: 3.8 10*3/uL (ref 1.4–7.0)
PLATELETS: 199 10*3/uL (ref 150–379)
RBC: 4.27 x10E6/uL (ref 4.14–5.80)
RDW: 13 % (ref 12.3–15.4)
WBC: 8.2 10*3/uL (ref 3.4–10.8)

## 2016-11-23 LAB — HGB A1C W/O EAG: Hgb A1c MFr Bld: 6.9 % — ABNORMAL HIGH (ref 4.8–5.6)

## 2016-11-23 LAB — GC/CHLAMYDIA PROBE AMP
CHLAMYDIA, DNA PROBE: NEGATIVE
Neisseria gonorrhoeae by PCR: NEGATIVE

## 2016-11-23 LAB — TSH: TSH: 1.42 u[IU]/mL (ref 0.450–4.500)

## 2016-11-23 LAB — RPR QUALITATIVE: RPR Ser Ql: NONREACTIVE

## 2016-11-24 LAB — URINE CULTURE: ORGANISM ID, BACTERIA: NO GROWTH

## 2016-11-25 ENCOUNTER — Other Ambulatory Visit: Payer: Self-pay

## 2016-11-25 MED ORDER — METRONIDAZOLE 500 MG PO TABS: ORAL_TABLET | ORAL | 0 refills | Status: DC

## 2016-12-19 ENCOUNTER — Encounter (HOSPITAL_COMMUNITY): Payer: Self-pay | Admitting: Emergency Medicine

## 2016-12-19 ENCOUNTER — Emergency Department (HOSPITAL_COMMUNITY)
Admission: EM | Admit: 2016-12-19 | Discharge: 2016-12-19 | Disposition: A | Discharge status: Home / Self Care | Payer: Self-pay | Attending: Emergency Medicine | Admitting: Emergency Medicine

## 2016-12-19 DIAGNOSIS — I10 Essential (primary) hypertension: Secondary | ICD-10-CM | POA: Insufficient documentation

## 2016-12-19 DIAGNOSIS — R300 Dysuria: Secondary | ICD-10-CM | POA: Insufficient documentation

## 2016-12-19 DIAGNOSIS — R369 Urethral discharge, unspecified: Secondary | ICD-10-CM | POA: Insufficient documentation

## 2016-12-19 DIAGNOSIS — E119 Type 2 diabetes mellitus without complications: Secondary | ICD-10-CM | POA: Insufficient documentation

## 2016-12-19 DIAGNOSIS — Z79899 Other long term (current) drug therapy: Secondary | ICD-10-CM | POA: Insufficient documentation

## 2016-12-19 DIAGNOSIS — Z113 Encounter for screening for infections with a predominantly sexual mode of transmission: Secondary | ICD-10-CM | POA: Insufficient documentation

## 2016-12-19 DIAGNOSIS — F17200 Nicotine dependence, unspecified, uncomplicated: Secondary | ICD-10-CM | POA: Insufficient documentation

## 2016-12-19 DIAGNOSIS — R319 Hematuria, unspecified: Secondary | ICD-10-CM | POA: Insufficient documentation

## 2016-12-19 DIAGNOSIS — Z7984 Long term (current) use of oral hypoglycemic drugs: Secondary | ICD-10-CM | POA: Insufficient documentation

## 2016-12-19 LAB — URINALYSIS, ROUTINE W REFLEX MICROSCOPIC
Bilirubin Urine: NEGATIVE
GLUCOSE, UA: 50 mg/dL — AB
KETONES UR: NEGATIVE mg/dL
Nitrite: NEGATIVE
PH: 6 (ref 5.0–8.0)
PROTEIN: NEGATIVE mg/dL
Specific Gravity, Urine: 1.016 (ref 1.005–1.030)
Squamous Epithelial / LPF: NONE SEEN

## 2016-12-19 MED ORDER — CEFTRIAXONE SODIUM 250 MG IJ SOLR
250.0000 mg | Freq: Once | INTRAMUSCULAR | Status: AC
  Administered 2016-12-19: 250 mg via INTRAMUSCULAR
  Filled 2016-12-19: qty 250

## 2016-12-19 MED ORDER — LIDOCAINE HCL (PF) 1 % IJ SOLN
INTRAMUSCULAR | Status: AC
  Administered 2016-12-19: 5 mL
  Filled 2016-12-19: qty 5

## 2016-12-19 MED ORDER — CEPHALEXIN 500 MG PO CAPS: 500.0000 mg | ORAL_CAPSULE | Freq: Two times a day (BID) | ORAL | 0 refills | Status: DC

## 2016-12-19 MED ORDER — AZITHROMYCIN 250 MG PO TABS
1000.0000 mg | ORAL_TABLET | Freq: Once | ORAL | Status: AC
  Administered 2016-12-19: 1000 mg via ORAL
  Filled 2016-12-19: qty 4
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
## 2016-12-19 NOTE — Discharge Instructions (Signed)
He had been treated for gonorrhea and chlamydia in the ED. Have given U prescription for antibiotics to cover for urinary tract infection. The cultures for the STD screenings will come back in 2 days. Make sure he inform all sexual partners that you have been treated for a sexually transmitted disease. Abstain from sexual intercourse for 14 days. May follow-up with the health clinic for further evaluation and treatment.

## 2016-12-19 NOTE — ED Notes (Signed)
Declined W/C at D/C and was escorted to lobby by RN. 

## 2016-12-20 ENCOUNTER — Ambulatory Visit (INDEPENDENT_AMBULATORY_CARE_PROVIDER_SITE_OTHER): Payer: Self-pay | Admitting: Internal Medicine

## 2016-12-20 ENCOUNTER — Encounter: Payer: Self-pay | Admitting: Internal Medicine

## 2016-12-20 VITALS — BP 156/80 | HR 78 | Resp 12 | Ht 74.25 in | Wt 201.0 lb

## 2016-12-20 DIAGNOSIS — R030 Elevated blood-pressure reading, without diagnosis of hypertension: Secondary | ICD-10-CM

## 2016-12-20 DIAGNOSIS — E1142 Type 2 diabetes mellitus with diabetic polyneuropathy: Secondary | ICD-10-CM

## 2016-12-20 LAB — GLUCOSE, POCT (MANUAL RESULT ENTRY): POC GLUCOSE: 205 mg/dL — AB (ref 70–99)

## 2016-12-20 LAB — GC/CHLAMYDIA PROBE AMP (~~LOC~~) NOT AT ARMC
Chlamydia: NEGATIVE
Neisseria Gonorrhea: POSITIVE — AB
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
## 2016-12-21 LAB — URINE CULTURE: CULTURE: NO GROWTH

## 2016-12-28 ENCOUNTER — Other Ambulatory Visit: Payer: Self-pay

## 2016-12-30 ENCOUNTER — Ambulatory Visit (INDEPENDENT_AMBULATORY_CARE_PROVIDER_SITE_OTHER): Payer: Self-pay

## 2016-12-30 VITALS — BP 130/80

## 2016-12-30 DIAGNOSIS — Z13220 Encounter for screening for lipoid disorders: Secondary | ICD-10-CM

## 2016-12-30 DIAGNOSIS — R030 Elevated blood-pressure reading, without diagnosis of hypertension: Secondary | ICD-10-CM

## 2016-12-30 NOTE — Progress Notes (Signed)
Informed patient to continue with what he is doing and if anything needs to change we will call him. Per Dr. Delrae AlfredMulberry she agrees with recommendation.

## 2016-12-31 LAB — LIPID PANEL W/O CHOL/HDL RATIO
CHOLESTEROL TOTAL: 162 mg/dL (ref 100–199)
HDL: 39 mg/dL — ABNORMAL LOW (ref >39)
LDL Calculated: 109 mg/dL — ABNORMAL HIGH (ref 0–99)
TRIGLYCERIDES: 70 mg/dL (ref 0–149)
VLDL Cholesterol Cal: 14 mg/dL (ref 5–40)

## 2017-01-01 MED ORDER — ATORVASTATIN CALCIUM 20 MG PO TABS: 20.0000 mg | ORAL_TABLET | Freq: Every day | ORAL | 11 refills | Status: DC

## 2017-01-01 NOTE — Addendum Note (Signed)
Addended by: Marcene DuosMULBERRY, Saloni Lablanc M on: 01/01/2017 12:13 PM   Modules accepted: Orders

## 2017-03-22 ENCOUNTER — Ambulatory Visit: Payer: Self-pay | Admitting: Internal Medicine

## 2017-08-09 ENCOUNTER — Ambulatory Visit: Payer: Self-pay | Admitting: Internal Medicine

## 2017-08-22 ENCOUNTER — Ambulatory Visit: Payer: Self-pay | Admitting: Internal Medicine

## 2018-03-05 ENCOUNTER — Encounter (HOSPITAL_COMMUNITY): Payer: Self-pay | Admitting: Emergency Medicine

## 2018-03-05 ENCOUNTER — Emergency Department (HOSPITAL_COMMUNITY)
Admission: EM | Admit: 2018-03-05 | Discharge: 2018-03-06 | Disposition: A | Discharge status: Home / Self Care | Payer: No Typology Code available for payment source | Attending: Emergency Medicine | Admitting: Emergency Medicine

## 2018-03-05 ENCOUNTER — Emergency Department (HOSPITAL_COMMUNITY): Payer: No Typology Code available for payment source

## 2018-03-05 DIAGNOSIS — Y92481 Parking lot as the place of occurrence of the external cause: Secondary | ICD-10-CM | POA: Diagnosis not present

## 2018-03-05 DIAGNOSIS — Y999 Unspecified external cause status: Secondary | ICD-10-CM | POA: Insufficient documentation

## 2018-03-05 DIAGNOSIS — S8992XA Unspecified injury of left lower leg, initial encounter: Secondary | ICD-10-CM | POA: Insufficient documentation

## 2018-03-05 DIAGNOSIS — W230XXA Caught, crushed, jammed, or pinched between moving objects, initial encounter: Secondary | ICD-10-CM | POA: Insufficient documentation

## 2018-03-05 DIAGNOSIS — Y939 Activity, unspecified: Secondary | ICD-10-CM | POA: Insufficient documentation
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
## 2018-03-06 NOTE — Discharge Instructions (Addendum)
Wear walking boot as needed for pain.  Follow-up with your primary care provider for recheck.  Elevate leg to help with pain and swelling.  Apply ice for 20 minutes at a time.  Take Motrin and Tylenol as needed as directed for pain.

## 2018-05-09 ENCOUNTER — Emergency Department (HOSPITAL_COMMUNITY)
Admission: EM | Admit: 2018-05-09 | Discharge: 2018-05-10 | Disposition: A | Discharge status: Home / Self Care | Payer: Self-pay | Attending: Emergency Medicine | Admitting: Emergency Medicine

## 2018-05-09 DIAGNOSIS — M7989 Other specified soft tissue disorders: Secondary | ICD-10-CM

## 2018-05-09 DIAGNOSIS — M79662 Pain in left lower leg: Secondary | ICD-10-CM

## 2018-05-09 DIAGNOSIS — F17200 Nicotine dependence, unspecified, uncomplicated: Secondary | ICD-10-CM | POA: Insufficient documentation

## 2018-05-09 DIAGNOSIS — E119 Type 2 diabetes mellitus without complications: Secondary | ICD-10-CM | POA: Insufficient documentation

## 2018-05-09 DIAGNOSIS — M79605 Pain in left leg: Secondary | ICD-10-CM | POA: Insufficient documentation

## 2018-05-09 DIAGNOSIS — I10 Essential (primary) hypertension: Secondary | ICD-10-CM | POA: Insufficient documentation
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
## 2018-05-09 NOTE — ED Notes (Signed)
BP in left arms read hypotensive x5 ~70-80/80-90. BP registers normal on right arm. RN Tori  notified.

## 2018-05-09 NOTE — ED Notes (Signed)
When collecting vitals in triage, Connor,EMT and this EMT noted a low BP of 74/56 with a MAP of 68 on the left arm and then retook the BP on the R arm and came back with a BP of 122/71. Delorise Jacksonori, Sort RN made aware by [employer] EMT.

## 2018-05-10 ENCOUNTER — Emergency Department (HOSPITAL_COMMUNITY): Payer: Self-pay

## 2018-05-10 ENCOUNTER — Emergency Department (HOSPITAL_BASED_OUTPATIENT_CLINIC_OR_DEPARTMENT_OTHER): Payer: Self-pay

## 2018-05-10 ENCOUNTER — Other Ambulatory Visit: Payer: Self-pay

## 2018-05-10 DIAGNOSIS — L538 Other specified erythematous conditions: Secondary | ICD-10-CM

## 2018-05-10 DIAGNOSIS — R52 Pain, unspecified: Secondary | ICD-10-CM

## 2018-05-10 DIAGNOSIS — M7989 Other specified soft tissue disorders: Secondary | ICD-10-CM

## 2018-05-10 LAB — CBC WITH DIFFERENTIAL/PLATELET
Abs Immature Granulocytes: 0.01 10*3/uL (ref 0.00–0.07)
Basophils Absolute: 0 10*3/uL (ref 0.0–0.1)
Basophils Relative: 1 %
EOS ABS: 0.2 10*3/uL (ref 0.0–0.5)
Eosinophils Relative: 3 %
HEMATOCRIT: 36.5 % — AB (ref 39.0–52.0)
HEMOGLOBIN: 11.9 g/dL — AB (ref 13.0–17.0)
Immature Granulocytes: 0 %
Lymphocytes Relative: 46 %
Lymphs Abs: 3.5 10*3/uL (ref 0.7–4.0)
MCH: 31.2 pg (ref 26.0–34.0)
MCHC: 32.6 g/dL (ref 30.0–36.0)
MCV: 95.8 fL (ref 80.0–100.0)
MONO ABS: 0.8 10*3/uL (ref 0.1–1.0)
Monocytes Relative: 10 %
Neutro Abs: 3 10*3/uL (ref 1.7–7.7)
Neutrophils Relative %: 40 %
Platelets: 115 10*3/uL — ABNORMAL LOW (ref 150–400)
RBC: 3.81 MIL/uL — AB (ref 4.22–5.81)
RDW: 13 % (ref 11.5–15.5)
WBC: 7.5 10*3/uL (ref 4.0–10.5)
nRBC: 0 % (ref 0.0–0.2)

## 2018-05-10 LAB — COMPREHENSIVE METABOLIC PANEL
ALBUMIN: 3.6 g/dL (ref 3.5–5.0)
ALT: 13 U/L (ref 0–44)
ANION GAP: 11 (ref 5–15)
AST: 17 U/L (ref 15–41)
Alkaline Phosphatase: 58 U/L (ref 38–126)
BILIRUBIN TOTAL: 0.4 mg/dL (ref 0.3–1.2)
BUN: 10 mg/dL (ref 6–20)
CALCIUM: 8.9 mg/dL (ref 8.9–10.3)
CO2: 24 mmol/L (ref 22–32)
Chloride: 105 mmol/L (ref 98–111)
Creatinine, Ser: 0.76 mg/dL (ref 0.61–1.24)
GFR calc non Af Amer: >60 mL/min (ref >60)
Glucose, Bld: 173 mg/dL — ABNORMAL HIGH (ref 70–99)
POTASSIUM: 3.6 mmol/L (ref 3.5–5.1)
SODIUM: 140 mmol/L (ref 135–145)
TOTAL PROTEIN: 5.9 g/dL — AB (ref 6.5–8.1)

## 2018-05-10 MED ORDER — IOPAMIDOL (ISOVUE-370) INJECTION 76%
125.0000 mL | Freq: Once | INTRAVENOUS | Status: AC | PRN
  Administered 2018-05-10: 125 mL via INTRAVENOUS

## 2018-05-10 MED ORDER — ACETAMINOPHEN 500 MG PO TABS
1000.0000 mg | ORAL_TABLET | Freq: Once | ORAL | Status: AC
  Administered 2018-05-10: 1000 mg via ORAL
  Filled 2018-05-10: qty 2

## 2018-05-10 MED ORDER — IOPAMIDOL (ISOVUE-370) INJECTION 76%
INTRAVENOUS | Status: AC
  Filled 2018-05-10: qty 150

## 2018-05-10 NOTE — ED Notes (Signed)
Patient requested that IV be removed; advised pt that we can remove after testing, but if he is admitted will need to place another. Patient agreed.

## 2018-05-10 NOTE — ED Notes (Signed)
PPt was provided with Malaw[place]urkey sandwich and water. Messner also advised pt that he needed US and that it would be 7a.m and after for him to have the test.
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
## 2018-05-10 NOTE — Progress Notes (Signed)
Left lower extremity venous duplex completed - Negative for a DVT or Baker's cyst. Enlargement  of the inguinal lymph nodes. Mild to moderate edema in the lower leg. Hartman Minahan,RVS 05/10/2018, 9:15 AM

## 2018-05-10 NOTE — ED Notes (Signed)
Provider at bedside
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
## 2018-05-10 NOTE — ED Provider Notes (Signed)
Good arterial flow. No evidence of DVT. Suspect lymphedema. Follow up with a primary care physician. Compression stockings. Elevation.     Cameron Bean, Ed Rayson, MD 05/10/18 [phone]

## 2018-05-10 NOTE — ED Notes (Signed)
Security at bedside speaking to patient in the hall.

## 2018-05-10 NOTE — ED Notes (Signed)
Patient verbalizes understanding of discharge instructions. Opportunity for questioning and answers were provided. Armband removed by staff, pt discharged from ED.  

## 2018-05-10 NOTE — ED Notes (Signed)
Pt transferred to vascular  

## 2018-05-10 NOTE — ED Notes (Signed)
Pt advised that provider would be back to discuss test results. Pt offered a stretcher, patient declined. Patient upset that he cannot leave and comeback for further testing.

## 2018-05-10 NOTE — Discharge Instructions (Addendum)
Elevated your left leg as much as possible  Purchase compression stockings for your left lower extremity  Develop a relationship with a primary care physician
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
## 2020-01-01 ENCOUNTER — Ambulatory Visit: Payer: No Typology Code available for payment source | Attending: Internal Medicine

## 2020-01-01 DIAGNOSIS — Z23 Encounter for immunization: Secondary | ICD-10-CM

## 2020-01-01 NOTE — Progress Notes (Signed)
   Covid-19 Vaccination Clinic  Name:  Huston Stonehocker    MRN: [id number] DOB: 05-Feb-1971  01/01/2020  Mr. Prouty was observed post Covid-19 immunization for 15 minutes without incident. He was provided with Vaccine Information Sheet and instruction to access the V-Safe system.   Mr. Spofford was instructed to call 911 with any severe reactions post vaccine: Marland Kitchen Difficulty breathing  . Swelling of face and throat  . A fast heartbeat  . A bad rash all over body  . Dizziness and weakness   Immunizations Administered    Name Date Dose VIS Date Route   Pfizer COVID-19 Vaccine 01/01/2020 12:52 PM 0.3 mL 07/18/2018 Intramuscular   Manufacturer: [employer], [employer]   Lot: [id number]   NDC: [address]-5

## 2020-01-22 ENCOUNTER — Ambulatory Visit: Payer: Self-pay

## 2020-01-29 ENCOUNTER — Ambulatory Visit: Payer: No Typology Code available for payment source | Attending: Internal Medicine

## 2020-01-29 DIAGNOSIS — Z23 Encounter for immunization: Secondary | ICD-10-CM

## 2020-01-29 NOTE — Progress Notes (Signed)
   Covid-19 Vaccination Clinic  Name:  Cameron Bean    MRN: [id number] DOB: 1970-09-26  01/29/2020  Mr. [name] was observed post Covid-19 immunization for 15 minutes without incident. He was provided with Vaccine Information Sheet and instruction to access the V-Safe system.   Mr. [name] was instructed to call 911 with any severe reactions post vaccine: Marland Kitchen Difficulty breathing  . Swelling of face and throat  . A fast heartbeat  . A bad rash all over body  . Dizziness and weakness   Immunizations Administered    Name Date Dose VIS Date Route   Pfizer COVID-19 Vaccine 01/29/2020  2:47 PM 0.3 mL 07/18/2018 Intramuscular   Manufacturer: [employer], [employer]   Lot: [id number]   NDC: [id number]

## 2022-02-17 ENCOUNTER — Encounter (HOSPITAL_COMMUNITY): Payer: Self-pay | Admitting: Emergency Medicine

## 2022-02-17 ENCOUNTER — Emergency Department (HOSPITAL_COMMUNITY)
Admission: EM | Admit: 2022-02-17 | Discharge: 2022-02-17 | Disposition: A | Discharge status: Home / Self Care | Payer: No Typology Code available for payment source | Attending: Emergency Medicine | Admitting: Emergency Medicine

## 2022-02-17 DIAGNOSIS — M23232 Derangement of other medial meniscus due to old tear or injury, left knee: Secondary | ICD-10-CM

## 2022-02-17 MED ORDER — CELECOXIB 200 MG PO CAPS: 200.0000 mg | ORAL_CAPSULE | Freq: Two times a day (BID) | ORAL | 0 refills | Status: AC

## 2022-02-17 MED ORDER — TRAMADOL HCL 50 MG PO TABS: 50.0000 mg | ORAL_TABLET | Freq: Four times a day (QID) | ORAL | 0 refills | Status: AC | PRN

## 2022-02-17 NOTE — Discharge Instructions (Signed)
Contact a health care provider if: You have a fever. Your knee becomes red, tender, or swollen. Your pain medicine is not controlling your pain. Your symptoms get worse or do not improve after 2 weeks of home care.

## 2022-02-17 NOTE — ED Provider Notes (Signed)
Cameron Bean   CSN: [id number] Arrival date & time: 02/17/22  1303     History  Chief Complaint  Patient presents with   Knee Pain    Cameron Bean is a 51 y.o. male who presents emergency department with knee pain and swelling.  He has a known meniscus tear of the left knee.  He had an MRI done at [facility].  He left his appointment but did not get any medication for pain.  He is hoping he can just get something because his pain is significant especially at night and keeping him awake.  He has had no changes in swelling but does think his pain is worse after cool weather is sudden.  He has follow-up with [facility].   Knee Pain      Home Medications Prior to Admission medications   Medication Sig Start Date End Date Taking? Authorizing Provider  Blood Glucose Monitoring Suppl (AGAMATRIX PRESTO) w/Device KIT Check sugar before meals twice daily 08/23/16   Mack Hook, MD  glucose blood (AGAMATRIX PRESTO TEST) test strip Use as instructed 08/23/16   Mack Hook, MD      Allergies    Patient has no known allergies.    Review of Systems   Review of Systems  Physical Exam Updated Vital Signs BP 92/77 (BP Location: Left Arm)   Pulse 78   Resp 15   SpO2 99%  Physical Exam Vitals and nursing Bean reviewed.  Constitutional:      General: He is not in acute distress.    Appearance: He is well-developed. He is not diaphoretic.  HENT:     Head: Normocephalic and atraumatic.  Eyes:     General: No scleral icterus.    Conjunctiva/sclera: Conjunctivae normal.  Cardiovascular:     Rate and Rhythm: Normal rate and regular rhythm.     Heart sounds: Normal heart sounds.  Pulmonary:     Effort: Pulmonary effort is normal. No respiratory distress.     Breath sounds: Normal breath sounds.  Abdominal:     Palpations: Abdomen is soft.     Tenderness: There is no abdominal tenderness.  Musculoskeletal:      Cervical back: Normal range of motion and neck supple.     Comments: Patient examined with splint in place.  He is using crutches.  Obvious effusion, tenderness of the knee, no heat or redness  Skin:    General: Skin is warm and dry.  Neurological:     Mental Status: He is alert.  Psychiatric:        Behavior: Behavior normal.     ED Results / Procedures / Treatments   Labs (all labs ordered are listed, but only abnormal results are displayed) Labs Reviewed - No data to display  EKG None  Radiology No results found.  Procedures Procedures    Medications Ordered in ED Medications - No data to display  ED Course/ Medical Decision Making/ A&P                           Medical Decision Making Patient here for chronic knee pain.  He has a known meniscus tear.  I was able to read his MRI report.  PDMP reviewed.  Will discharge with tramadol and Celebrex.  He is referred back to [facility] for follow-up.  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
